# Patient Record
Sex: Female | Born: 1960 | Race: White | Hispanic: No | Marital: Married | Smoking: Never smoker
Health system: Southern US, Community
[De-identification: ages and names within clinical notes are randomized; demographics above are authoritative.]

## PROBLEM LIST (undated history)

## (undated) HISTORY — DX: Hemochromatosis, unspecified: E83.119

## (undated) HISTORY — PX: BACK SURGERY: SHX140

---

## 1998-03-08 ENCOUNTER — Other Ambulatory Visit: Admission: RE | Admit: 1998-03-08 | Discharge: 1998-03-08 | Payer: Self-pay | Admitting: Obstetrics & Gynecology

## 1998-04-13 ENCOUNTER — Ambulatory Visit (HOSPITAL_COMMUNITY): Admission: RE | Admit: 1998-04-13 | Discharge: 1998-04-13 | Payer: Self-pay | Admitting: Obstetrics & Gynecology

## 1998-07-12 ENCOUNTER — Ambulatory Visit (HOSPITAL_COMMUNITY): Admission: RE | Admit: 1998-07-12 | Discharge: 1998-07-12 | Payer: Self-pay | Admitting: Oral & Maxillofacial Surgery

## 1998-07-12 ENCOUNTER — Encounter: Payer: Self-pay | Admitting: Oral & Maxillofacial Surgery

## 1999-06-28 ENCOUNTER — Other Ambulatory Visit: Admission: RE | Admit: 1999-06-28 | Discharge: 1999-06-28 | Payer: Self-pay | Admitting: Obstetrics & Gynecology

## 2000-07-04 ENCOUNTER — Other Ambulatory Visit: Admission: RE | Admit: 2000-07-04 | Discharge: 2000-07-04 | Payer: Self-pay | Admitting: Obstetrics & Gynecology

## 2001-07-17 ENCOUNTER — Other Ambulatory Visit: Admission: RE | Admit: 2001-07-17 | Discharge: 2001-07-17 | Payer: Self-pay | Admitting: Obstetrics & Gynecology

## 2002-07-30 ENCOUNTER — Other Ambulatory Visit: Admission: RE | Admit: 2002-07-30 | Discharge: 2002-07-30 | Payer: Self-pay | Admitting: Obstetrics & Gynecology

## 2003-10-04 ENCOUNTER — Other Ambulatory Visit: Admission: RE | Admit: 2003-10-04 | Discharge: 2003-10-04 | Payer: Self-pay | Admitting: Obstetrics & Gynecology

## 2004-10-19 ENCOUNTER — Other Ambulatory Visit: Admission: RE | Admit: 2004-10-19 | Discharge: 2004-10-19 | Payer: Self-pay | Admitting: Obstetrics & Gynecology

## 2009-07-28 ENCOUNTER — Ambulatory Visit: Payer: Self-pay | Admitting: Vascular Surgery

## 2009-07-28 ENCOUNTER — Inpatient Hospital Stay (HOSPITAL_COMMUNITY): Admission: RE | Admit: 2009-07-28 | Discharge: 2009-07-29 | Payer: Self-pay | Admitting: Neurological Surgery

## 2009-08-23 ENCOUNTER — Encounter: Admission: RE | Admit: 2009-08-23 | Discharge: 2009-08-23 | Payer: Self-pay | Admitting: Neurological Surgery

## 2009-10-24 ENCOUNTER — Encounter: Admission: RE | Admit: 2009-10-24 | Discharge: 2009-10-24 | Payer: Self-pay | Admitting: Neurological Surgery

## 2009-11-28 ENCOUNTER — Encounter: Admission: RE | Admit: 2009-11-28 | Discharge: 2009-11-28 | Payer: Self-pay | Admitting: Neurological Surgery

## 2009-12-06 ENCOUNTER — Encounter: Admission: RE | Admit: 2009-12-06 | Discharge: 2009-12-06 | Payer: Self-pay | Admitting: Obstetrics & Gynecology

## 2010-02-28 ENCOUNTER — Encounter: Admission: RE | Admit: 2010-02-28 | Discharge: 2010-02-28 | Payer: Self-pay | Admitting: Neurological Surgery

## 2010-03-01 ENCOUNTER — Encounter: Admission: RE | Admit: 2010-03-01 | Discharge: 2010-03-01 | Payer: Self-pay | Admitting: Family Medicine

## 2010-07-16 ENCOUNTER — Encounter: Payer: Self-pay | Admitting: Obstetrics & Gynecology

## 2010-09-11 LAB — TYPE AND SCREEN
ABO/RH(D): O NEG
Antibody Screen: NEGATIVE

## 2010-09-11 LAB — BASIC METABOLIC PANEL
Calcium: 9.4 mg/dL (ref 8.4–10.5)
GFR calc Af Amer: >60 mL/min (ref >60)
Glucose, Bld: 79 mg/dL (ref 70–99)
Potassium: 3.8 mEq/L (ref 3.5–5.1)
Sodium: 136 mEq/L (ref 135–145)

## 2010-09-11 LAB — DIFFERENTIAL
Basophils Absolute: 0 K/uL (ref 0.0–0.1)
Basophils Relative: 0 % (ref 0–1)
Eosinophils Absolute: 0.1 K/uL (ref 0.0–0.7)
Eosinophils Relative: 2 % (ref 0–5)
Lymphocytes Relative: 28 % (ref 12–46)
Lymphs Abs: 2.4 K/uL (ref 0.7–4.0)
Monocytes Absolute: 0.6 K/uL (ref 0.1–1.0)
Monocytes Relative: 7 % (ref 3–12)
Neutro Abs: 5.5 K/uL (ref 1.7–7.7)
Neutrophils Relative %: 64 % (ref 43–77)

## 2010-09-11 LAB — ABO/RH: ABO/RH(D): O NEG

## 2010-09-11 LAB — PROTIME-INR
INR: 0.98 (ref 0.00–1.49)
Prothrombin Time: 12.9 s (ref 11.6–15.2)

## 2010-09-11 LAB — CBC
HCT: 40.4 % (ref 36.0–46.0)
Hemoglobin: 14.2 g/dL (ref 12.0–15.0)
MCHC: 35 g/dL (ref 30.0–36.0)
Platelets: 241 10*3/uL (ref 150–400)
WBC: 8.7 10*3/uL (ref 4.0–10.5)

## 2010-09-11 LAB — APTT: aPTT: 31 s (ref 24–37)

## 2012-03-04 ENCOUNTER — Telehealth: Payer: Self-pay | Admitting: Oncology

## 2012-03-04 NOTE — Telephone Encounter (Signed)
C/D on 9/10 for 9/18

## 2012-03-04 NOTE — Telephone Encounter (Signed)
S/W pt in re NP appt 9/18 @ 3 w/Dr. Cyndie Chime.  Dx-Hematochromatosis NP packet mail.

## 2012-03-12 ENCOUNTER — Other Ambulatory Visit: Payer: BC Managed Care – PPO

## 2012-03-12 ENCOUNTER — Ambulatory Visit (HOSPITAL_BASED_OUTPATIENT_CLINIC_OR_DEPARTMENT_OTHER): Payer: BC Managed Care – PPO | Admitting: Oncology

## 2012-03-12 ENCOUNTER — Telehealth: Payer: Self-pay | Admitting: Oncology

## 2012-03-12 ENCOUNTER — Encounter: Payer: Self-pay | Admitting: Oncology

## 2012-03-12 ENCOUNTER — Ambulatory Visit: Payer: Self-pay

## 2012-03-12 DIAGNOSIS — Z1589 Genetic susceptibility to other disease: Secondary | ICD-10-CM

## 2012-03-12 HISTORY — DX: Hemochromatosis, unspecified: E83.119

## 2012-03-12 NOTE — Telephone Encounter (Signed)
Gave pt appt for September 2014 lab before MD visit

## 2012-03-12 NOTE — Progress Notes (Signed)
New Patient Hematology-Oncology Evaluation   Michele Bradford [id number] 03/28/1961 51 y.o. 03/12/2012  CC: Dr. Leslee Home; Dr. 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 She has been working for [employer] for the last 5 years. They do annual laboratory screening which includes a routine iron level. Her iron levels have always been high normal. The value done in August 2008 was 145(normal 35-155), in September 2009, 148, in September 2010 and 160, in September 2011, 107, in September 2012 76, and most recently on 02/08/2012 serum iron 195, percent saturation 59, ferritin 49. Liver functions done on 01/30/2012 with a serum iron of 206 were all normal colon bilirubin 0.6, SGOT 15, SGPT 8, alkaline phosphatase 50. She has a normal CBC with hemoglobin 14, hematocrit 43, MCV 93, white count 5800, 56 neutrophils, 33 lymphocytes, platelets 248,000.  She had a uterine ablation procedure in 1999. She has not had a regular period since that time.  Of note she is a frequent blood donor since she was a young woman and donates blood approximately every 2 months.  She has no history of any liver trouble and specifically denies a history of hepatitis, yellow jaundice, or malaria.   There is no family history of hemochromatosis. A paternal uncle died of complications of cirrhosis but was an alcohol user. A sister age 82 had Hodgkin's lymphoma currently in remission, another sister age 28 and neither one has been checked. Her father was evaluated for anemia last year he is 87 years old. Her mother has breast cancer in remission. She has one child a son age 11 who has been tested but results still outstanding.  PMH: Past Medical History  Diagnosis Date  . Hemochromatosis 03/12/2012    Homozygote H63D  02/08/12   no history of hypertension, diabetes, ulcers, She takes Lamictal for a mood disorder  Surgery: Endometrial ablation; low back surgery by Dr. Yetta Barre about 3 years ago  Allergies: Allergies  Allergen Reactions  . Other     Ragweed, dust, dust mites, seasonal    Medications: Lamictal 100 mg daily, vitamin D supplement 1000 units daily, B complex vitamins 1 daily, sulfacetamide sodium lotion when necessary  Social History:  She is married. She works as an Product/process development scientist for [employer] she is a nonsmoker. She does like to drink wine especially red wine. Son age 65 who is healthy.    Family History: See history of present illness  Review of Systems: Constitutional symptoms: No constitutional symptoms HEENT: Respiratory: No cough or dyspnea Cardiovascular:  No chest pain or palpitations Gastrointestinal ROS:  Genito-Urinary ROS: See above Hematological and Lymphatic: Musculoskeletal: No muscle or bone pain Neurologic: Rare migraine headache Dermatologic: Remaining ROS negative.  Physical Exam: Blood pressure 127/82, pulse 83, temperature 98.1 F (36.7 C), temperature source Oral, resp. rate 20, height 5\' 9"  (1.753 m), weight 171 lb 8 oz (77.792 kg). Wt Readings from Last 3 Encounters:  03/12/12 171 lb 8 oz (77.792 kg)    General appearance: Healthy appearing Caucasian woman looking younger than her stated age Head: Normal Neck: Full range of motion Lymph nodes: Not examined Breasts: Not examined Lungs: Clear to auscultation resonant to percussion Heart: Regular rhythm no murmur Abdominal: Soft nontender no mass no organomegaly GU: Not examined Extremities: No edema no calf tenderness Neurologic: Motor  strength 5 over 5, reflexes 1+ symmetric, PERRLA, optic discs sharp vessels normal Skin: No bronzing, no hyperpigmentation in the skin folds    Lab Results: Lab Results  Component Value Date   WBC 8.7 07/22/2009   HGB 14.2 07/22/2009   HCT 40.4  07/22/2009   MCV 94.3 07/22/2009   PLT 241 07/22/2009     Chemistry      Component Value Date/Time   NA 136 07/22/2009 0856   K 3.8 07/22/2009 0856   CL 101 07/22/2009 0856   CO2 26 07/22/2009 0856   BUN 15 07/22/2009 0856   CREATININE 0.81 07/22/2009 0856      Component Value Date/Time   CALCIUM 9.4 07/22/2009 0856        Impression and Plan: 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 It is virtually never associated with clinical hemochromatosis unless there is concomitant liver disease. She has a normal ferritin so there is no indication for phlebotomy at this time.   Recommendation: Patient reassured that this is not a "disease" and it should follow a totally benign course. I would check a ferritin level alone every 6 months and then if stable once a year. As long as ferritin level remains in the normal range, there is no indication for phlebotomy. We talked about the status of oral iron chelators. At present there are 2 FDA approved drugs. The one most commonly used in this country his Exjade. The drug is expensive and not as efficient in removing iron as simple phlebotomy. I anticipate in time we will have him better alternatives.      Levert Feinstein, MD 03/12/2012, 5:12 PM

## 2013-03-10 ENCOUNTER — Other Ambulatory Visit: Payer: BC Managed Care – PPO | Admitting: Lab

## 2013-03-17 ENCOUNTER — Ambulatory Visit: Payer: BC Managed Care – PPO | Admitting: Oncology

## 2013-08-24 ENCOUNTER — Encounter: Payer: Self-pay | Admitting: Oncology

## 2014-09-22 ENCOUNTER — Ambulatory Visit
Admission: RE | Admit: 2014-09-22 | Discharge: 2014-09-22 | Disposition: A | Discharge status: Home / Self Care | Payer: BLUE CROSS/BLUE SHIELD | Source: Ambulatory Visit | Attending: Family Medicine | Admitting: Family Medicine

## 2014-09-22 ENCOUNTER — Other Ambulatory Visit (HOSPITAL_COMMUNITY): Payer: Self-pay | Admitting: Family Medicine

## 2014-09-22 DIAGNOSIS — R05 Cough: Secondary | ICD-10-CM

## 2014-09-22 DIAGNOSIS — R509 Fever, unspecified: Secondary | ICD-10-CM

## 2014-09-22 DIAGNOSIS — R059 Cough, unspecified: Secondary | ICD-10-CM

## 2016-02-13 ENCOUNTER — Other Ambulatory Visit: Payer: Self-pay | Admitting: Obstetrics & Gynecology

## 2016-02-13 DIAGNOSIS — R928 Other abnormal and inconclusive findings on diagnostic imaging of breast: Secondary | ICD-10-CM

## 2016-02-15 ENCOUNTER — Ambulatory Visit
Admission: RE | Admit: 2016-02-15 | Discharge: 2016-02-15 | Disposition: A | Discharge status: Home / Self Care | Payer: BLUE CROSS/BLUE SHIELD | Source: Ambulatory Visit | Attending: Obstetrics & Gynecology | Admitting: Obstetrics & Gynecology

## 2016-02-15 DIAGNOSIS — R928 Other abnormal and inconclusive findings on diagnostic imaging of breast: Secondary | ICD-10-CM

## 2019-01-05 ENCOUNTER — Emergency Department (HOSPITAL_COMMUNITY): Payer: BC Managed Care – PPO

## 2019-01-05 ENCOUNTER — Encounter (HOSPITAL_COMMUNITY): Payer: Self-pay | Admitting: Emergency Medicine

## 2019-01-05 ENCOUNTER — Emergency Department (HOSPITAL_COMMUNITY)
Admission: EM | Admit: 2019-01-05 | Discharge: 2019-01-05 | Disposition: A | Discharge status: Home / Self Care | Payer: BC Managed Care – PPO | Attending: Emergency Medicine | Admitting: Emergency Medicine

## 2019-01-05 ENCOUNTER — Other Ambulatory Visit: Payer: Self-pay

## 2019-01-05 DIAGNOSIS — M25521 Pain in right elbow: Secondary | ICD-10-CM | POA: Diagnosis present

## 2019-01-05 DIAGNOSIS — M25561 Pain in right knee: Secondary | ICD-10-CM | POA: Diagnosis not present

## 2019-01-05 DIAGNOSIS — M7918 Myalgia, other site: Secondary | ICD-10-CM

## 2019-01-05 DIAGNOSIS — M25551 Pain in right hip: Secondary | ICD-10-CM | POA: Insufficient documentation

## 2019-01-05 DIAGNOSIS — Z79899 Other long term (current) drug therapy: Secondary | ICD-10-CM | POA: Diagnosis not present
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
